# Patient Record
Sex: Male | Born: 1935 | Race: White | Hispanic: No | Marital: Married
Health system: Southern US, Community
[De-identification: ages and names within clinical notes are randomized; demographics above are authoritative.]

---

## 2020-06-09 ENCOUNTER — Emergency Department: Payer: No Typology Code available for payment source

## 2020-06-09 ENCOUNTER — Emergency Department
Admission: EM | Admit: 2020-06-09 | Discharge: 2020-06-09 | Disposition: A | Discharge status: Home / Self Care | Payer: No Typology Code available for payment source | Attending: Emergency Medicine | Admitting: Emergency Medicine

## 2020-06-09 ENCOUNTER — Encounter: Payer: Self-pay | Admitting: Physician Assistant

## 2020-06-09 ENCOUNTER — Other Ambulatory Visit: Payer: Self-pay

## 2020-06-09 DIAGNOSIS — M25511 Pain in right shoulder: Secondary | ICD-10-CM | POA: Insufficient documentation

## 2020-06-09 DIAGNOSIS — S7002XA Contusion of left hip, initial encounter: Secondary | ICD-10-CM | POA: Insufficient documentation

## 2020-06-09 DIAGNOSIS — Y92410 Unspecified street and highway as the place of occurrence of the external cause: Secondary | ICD-10-CM | POA: Diagnosis not present

## 2020-06-09 DIAGNOSIS — S79912A Unspecified injury of left hip, initial encounter: Secondary | ICD-10-CM | POA: Diagnosis present

## 2020-06-09 DIAGNOSIS — Y9389 Activity, other specified: Secondary | ICD-10-CM | POA: Insufficient documentation

## 2020-06-09 DIAGNOSIS — S0990XA Unspecified injury of head, initial encounter: Secondary | ICD-10-CM | POA: Insufficient documentation

## 2020-06-09 DIAGNOSIS — M7918 Myalgia, other site: Secondary | ICD-10-CM

## 2020-06-09 MED ORDER — TRAMADOL HCL 50 MG PO TABS: 50.0000 mg | ORAL_TABLET | Freq: Three times a day (TID) | ORAL | 0 refills | Status: AC | PRN

## 2020-06-09 MED ORDER — ACETAMINOPHEN 325 MG PO TABS
650.0000 mg | ORAL_TABLET | Freq: Once | ORAL | Status: AC
  Administered 2020-06-09: 650 mg via ORAL
  Filled 2020-06-09: qty 2
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
## 2020-06-09 NOTE — Discharge Instructions (Signed)
You exam, XRs and CT scans are normal at this time. Follow-up with your provider for continued symptoms. Return as needed.
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
## 2022-02-14 IMAGING — CR DG HIP (WITH OR WITHOUT PELVIS) 2-3V*R*
1 series · 3 of 3 positions shown · non-contrast
Comparison: None.

CLINICAL DATA: Pain

EXAM:
DG HIP (WITH OR WITHOUT PELVIS) 2-3V RIGHT

[Series 1: dg hip unilat w or w/o pelvis 2-3 views  · non-contrast · 0.14mm/px · 3 of 3 slices shown]
[im 1/3]
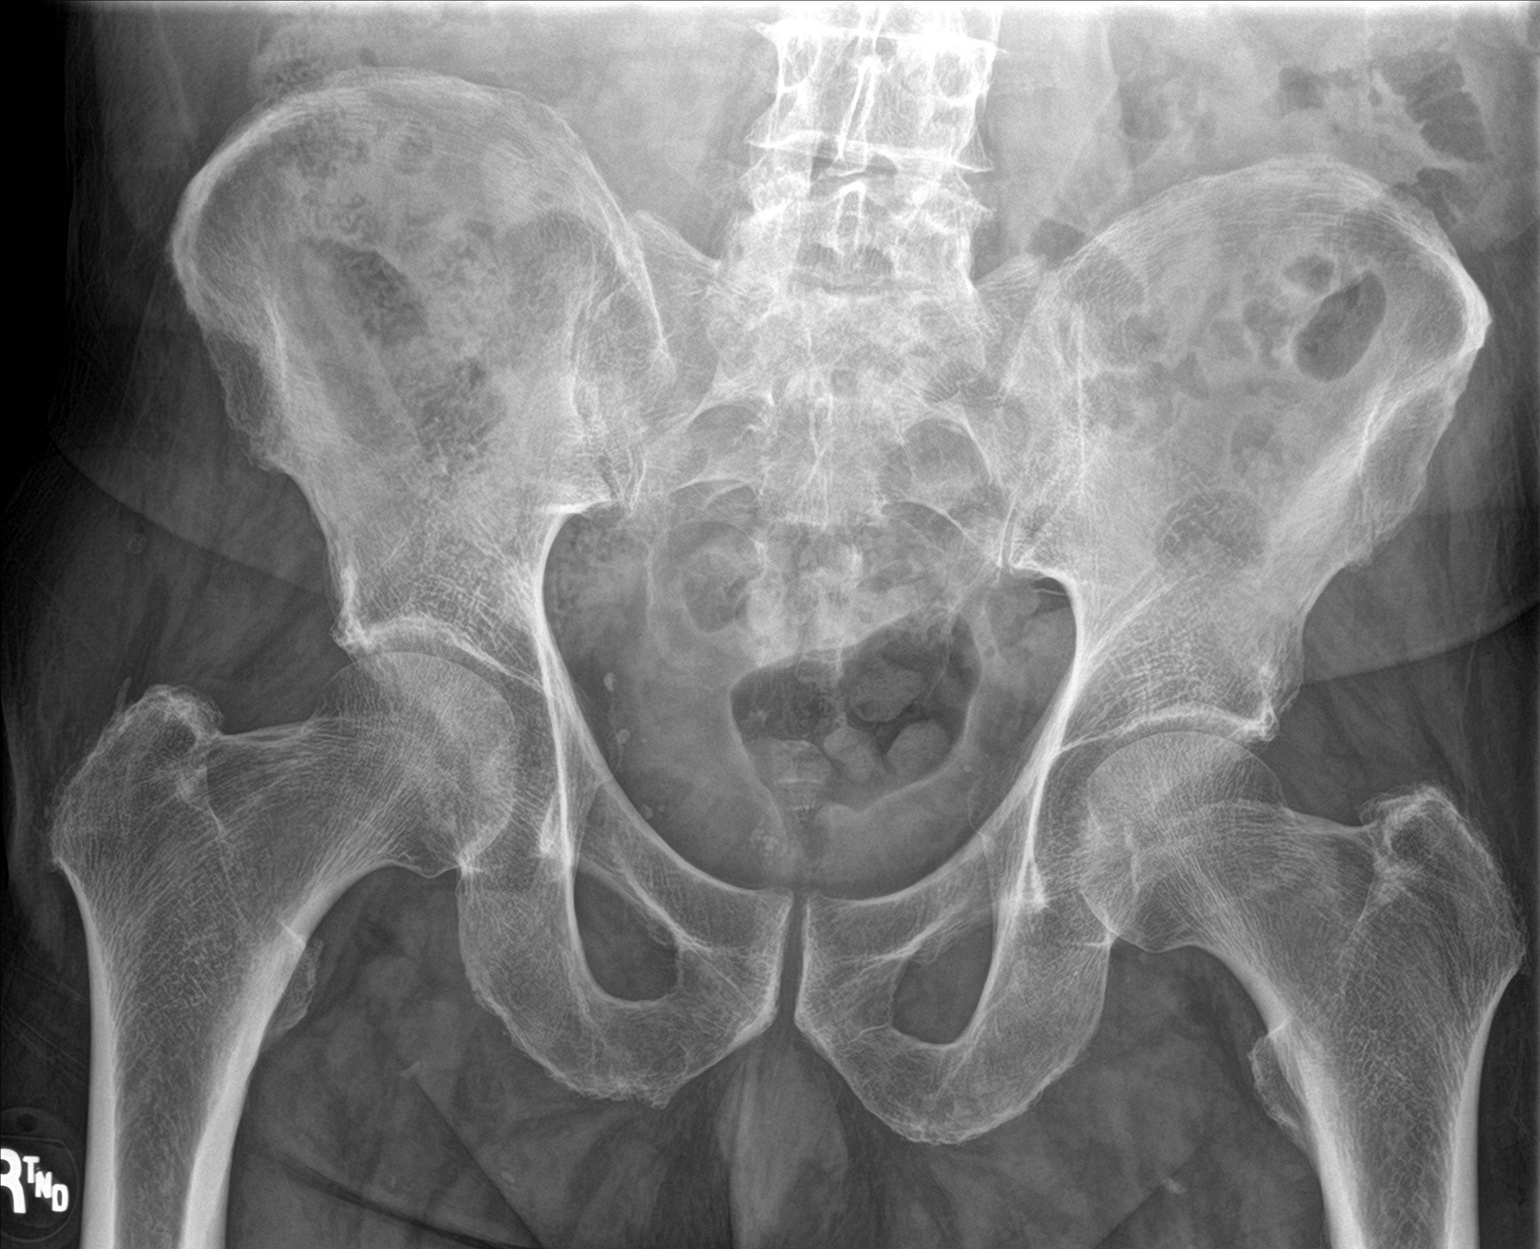
[im 2/3]
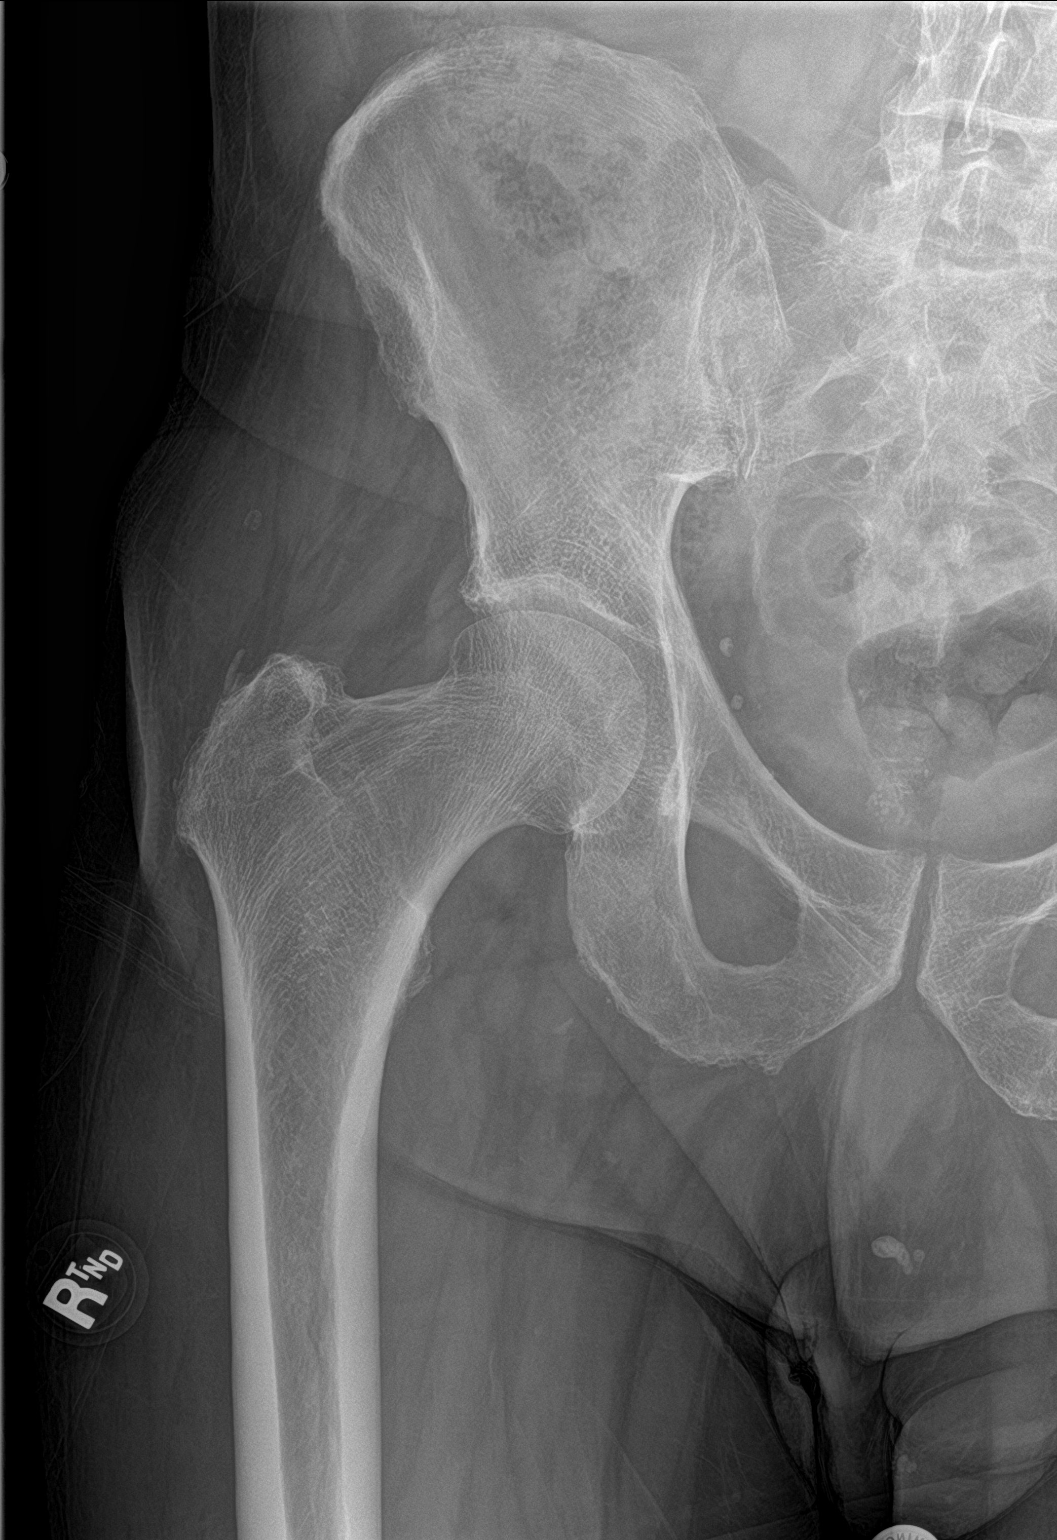
[im 3/3]
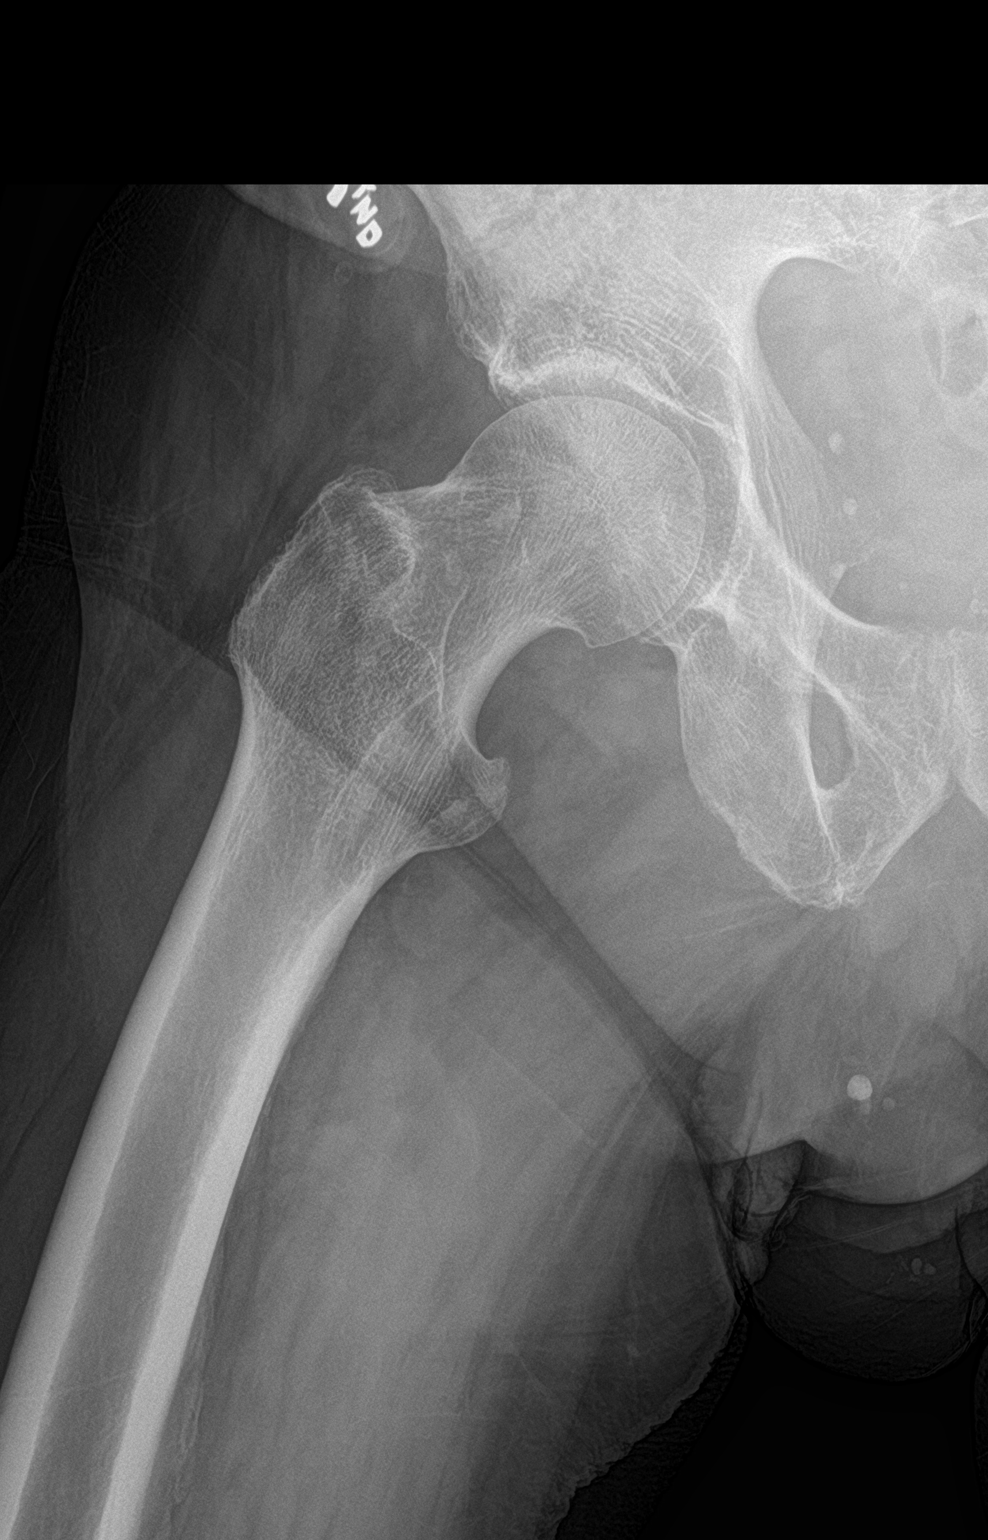

[3 of 3 positions shown; findings below may reference images not displayed]

FINDINGS: There is no evidence of hip fracture or dislocation. There is no
evidence of arthropathy or other focal bone abnormality.
IMPRESSION: Negative.
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
